# Patient Record
Sex: Male | Born: 1990 | Race: White | Hispanic: No | Marital: Single | Smoking: Never smoker
Health system: Southern US, Community
[De-identification: ages and names within clinical notes are randomized; demographics above are authoritative.]

---

## 2019-03-25 ENCOUNTER — Other Ambulatory Visit: Payer: Self-pay

## 2019-03-25 ENCOUNTER — Encounter (HOSPITAL_BASED_OUTPATIENT_CLINIC_OR_DEPARTMENT_OTHER): Payer: Self-pay | Admitting: Emergency Medicine

## 2019-03-25 ENCOUNTER — Emergency Department (HOSPITAL_BASED_OUTPATIENT_CLINIC_OR_DEPARTMENT_OTHER)
Admission: EM | Admit: 2019-03-25 | Discharge: 2019-03-26 | Disposition: A | Discharge status: Home / Self Care | Payer: 59 | Attending: Emergency Medicine | Admitting: Emergency Medicine

## 2019-03-25 ENCOUNTER — Emergency Department (HOSPITAL_BASED_OUTPATIENT_CLINIC_OR_DEPARTMENT_OTHER): Payer: 59

## 2019-03-25 DIAGNOSIS — R109 Unspecified abdominal pain: Secondary | ICD-10-CM | POA: Diagnosis present

## 2019-03-25 DIAGNOSIS — E876 Hypokalemia: Secondary | ICD-10-CM | POA: Diagnosis not present

## 2019-03-25 DIAGNOSIS — N23 Unspecified renal colic: Secondary | ICD-10-CM

## 2019-03-25 LAB — CBC WITH DIFFERENTIAL/PLATELET
Abs Immature Granulocytes: 0.08 10*3/uL — ABNORMAL HIGH (ref 0.00–0.07)
Basophils Absolute: 0 10*3/uL (ref 0.0–0.1)
Basophils Relative: 0 %
Eosinophils Absolute: 0.1 10*3/uL (ref 0.0–0.5)
Eosinophils Relative: 0 %
HCT: 47.4 % (ref 39.0–52.0)
Hemoglobin: 16.9 g/dL (ref 13.0–17.0)
Immature Granulocytes: 1 %
Lymphocytes Relative: 13 %
Lymphs Abs: 1.7 10*3/uL (ref 0.7–4.0)
MCH: 31.8 pg (ref 26.0–34.0)
MCHC: 35.7 g/dL (ref 30.0–36.0)
MCV: 89.1 fL (ref 80.0–100.0)
Monocytes Absolute: 1.2 10*3/uL — ABNORMAL HIGH (ref 0.1–1.0)
Monocytes Relative: 10 %
Neutro Abs: 9.7 10*3/uL — ABNORMAL HIGH (ref 1.7–7.7)
Neutrophils Relative %: 76 %
Platelets: 208 10*3/uL (ref 150–400)
RBC: 5.32 MIL/uL (ref 4.22–5.81)
RDW: 12 % (ref 11.5–15.5)
WBC: 12.7 10*3/uL — ABNORMAL HIGH (ref 4.0–10.5)
nRBC: 0 % (ref 0.0–0.2)

## 2019-03-25 LAB — URINALYSIS, MICROSCOPIC (REFLEX)
Bacteria, UA: NONE SEEN
Squamous Epithelial / HPF: NONE SEEN (ref 0–5)

## 2019-03-25 LAB — COMPREHENSIVE METABOLIC PANEL
ALT: 33 U/L (ref 0–44)
AST: 25 U/L (ref 15–41)
Albumin: 4.9 g/dL (ref 3.5–5.0)
Alkaline Phosphatase: 63 U/L (ref 38–126)
Anion gap: 16 — ABNORMAL HIGH (ref 5–15)
BUN: 16 mg/dL (ref 6–20)
CO2: 21 mmol/L — ABNORMAL LOW (ref 22–32)
Calcium: 9.7 mg/dL (ref 8.9–10.3)
Chloride: 97 mmol/L — ABNORMAL LOW (ref 98–111)
Creatinine, Ser: 1.12 mg/dL (ref 0.61–1.24)
GFR calc Af Amer: >60 mL/min (ref >60)
GFR calc non Af Amer: >60 mL/min (ref >60)
Glucose, Bld: 121 mg/dL — ABNORMAL HIGH (ref 70–99)
Potassium: 2.4 mmol/L — CL (ref 3.5–5.1)
Sodium: 134 mmol/L — ABNORMAL LOW (ref 135–145)
Total Bilirubin: 1.5 mg/dL — ABNORMAL HIGH (ref 0.3–1.2)
Total Protein: 8.3 g/dL — ABNORMAL HIGH (ref 6.5–8.1)

## 2019-03-25 LAB — URINALYSIS, ROUTINE W REFLEX MICROSCOPIC
Bilirubin Urine: NEGATIVE
Glucose, UA: NEGATIVE mg/dL
Ketones, ur: 40 mg/dL — AB
Leukocytes,Ua: NEGATIVE
Nitrite: NEGATIVE
Protein, ur: NEGATIVE mg/dL
Specific Gravity, Urine: 1.015 (ref 1.005–1.030)
pH: 6 (ref 5.0–8.0)

## 2019-03-25 LAB — MAGNESIUM: Magnesium: 1.7 mg/dL (ref 1.7–2.4)

## 2019-03-25 MED ORDER — ONDANSETRON HCL 4 MG/2ML IJ SOLN
4.0000 mg | Freq: Once | INTRAMUSCULAR | Status: AC
  Administered 2019-03-25: 4 mg via INTRAVENOUS
  Filled 2019-03-25: qty 2

## 2019-03-25 MED ORDER — ONDANSETRON 4 MG PO TBDP: ORAL_TABLET | ORAL | 0 refills | Status: AC

## 2019-03-25 MED ORDER — POTASSIUM CHLORIDE 10 MEQ/100ML IV SOLN
10.0000 meq | INTRAVENOUS | Status: AC
  Administered 2019-03-25 – 2019-03-26 (×3): 10 meq via INTRAVENOUS
  Filled 2019-03-25 (×2): qty 100

## 2019-03-25 MED ORDER — SODIUM CHLORIDE 0.9 % IV BOLUS
1000.0000 mL | Freq: Once | INTRAVENOUS | Status: AC
  Administered 2019-03-25: 1000 mL via INTRAVENOUS

## 2019-03-25 MED ORDER — POTASSIUM CHLORIDE CRYS ER 20 MEQ PO TBCR: 20.0000 meq | EXTENDED_RELEASE_TABLET | Freq: Every day | ORAL | 0 refills | Status: AC

## 2019-03-25 MED ORDER — KETOROLAC TROMETHAMINE 30 MG/ML IJ SOLN
30.0000 mg | Freq: Once | INTRAMUSCULAR | Status: AC
  Administered 2019-03-25: 30 mg via INTRAVENOUS
  Filled 2019-03-25: qty 1

## 2019-03-25 MED ORDER — POTASSIUM CHLORIDE CRYS ER 20 MEQ PO TBCR
40.0000 meq | EXTENDED_RELEASE_TABLET | Freq: Once | ORAL | Status: AC
  Administered 2019-03-25: 40 meq via ORAL
  Filled 2019-03-25: qty 2

## 2019-03-25 MED ORDER — POTASSIUM CHLORIDE 10 MEQ/100ML IV SOLN
10.0000 meq | Freq: Once | INTRAVENOUS | Status: AC
  Administered 2019-03-25: 10 meq via INTRAVENOUS

## 2019-03-25 MED ORDER — HYDROMORPHONE HCL 1 MG/ML IJ SOLN
1.0000 mg | Freq: Once | INTRAMUSCULAR | Status: AC
  Administered 2019-03-25: 1 mg via INTRAVENOUS
  Filled 2019-03-25: qty 1

## 2019-03-25 MED ORDER — PROMETHAZINE HCL 25 MG/ML IJ SOLN
INTRAMUSCULAR | Status: AC
  Administered 2019-03-25: 25 mg via INTRAVENOUS
  Filled 2019-03-25: qty 1

## 2019-03-25 MED ORDER — PROMETHAZINE HCL 25 MG/ML IJ SOLN
25.0000 mg | Freq: Once | INTRAMUSCULAR | Status: AC
  Administered 2019-03-25: 23:00:00 25 mg via INTRAVENOUS

## 2019-03-25 NOTE — ED Provider Notes (Signed)
[facility] DEPARTMENT Provider Note   CSN: [id number] Arrival date & time: 03/25/19  2030     History   Chief Complaint Chief Complaint  Patient presents with   Flank Pain    HPI Kenneth Frederick is a 28 y.o. male here presenting with left flank pain, left lower quadrant pain, vomiting, abdominal cramps.  Patient states that he has been having left flank pain left lower quadrant pain for the last 3 to 4 days.  He states that the pain is sharp and radiates to the left groin .  Patient states that he went to urgent care earlier today and was noted to have blood in his urine and was given antibiotics and Norco for pain. He states that he has been throwing up and his pain is not controlled with the pain medicine.  Patient has no history of kidney stones but mother had previous kidney stones.     The history is provided by the patient.    History reviewed. No pertinent past medical history.  There are no active problems to display for this patient.   History reviewed. No pertinent surgical history.      Home Medications    Prior to Admission medications   Not on File    Family History Family History  Problem Relation Age of Onset   Hypertension Mother    Heart attack Other    Diverticulitis Other     Social History Social History   Tobacco Use   Smoking status: Never Smoker   Smokeless tobacco: Never Used  Substance Use Topics   Alcohol use: Yes    Comment: occ   Drug use: Yes    Types: Marijuana     Allergies   Patient has no known allergies.   Review of Systems Review of Systems  Genitourinary: Positive for flank pain.  All other systems reviewed and are negative.    Physical Exam Updated Vital Signs BP (!) 149/100 (BP Location: Left Arm)    Pulse 85    Temp 98.1 F (36.7 C) (Oral)    Resp 16    Ht 5\' 10"  (1.778 m)    Wt 78 kg    SpO2 100%    BMI 24.68 kg/m   Physical Exam Vitals signs and nursing note reviewed.    Constitutional:      Comments: Uncomfortable, pacing around   HENT:     Head: Normocephalic.     Right Ear: Tympanic membrane normal.     Nose: Nose normal.     Mouth/Throat:     Mouth: Mucous membranes are dry.  Eyes:     Extraocular Movements: Extraocular movements intact.     Pupils: Pupils are equal, round, and reactive to light.  Neck:     Musculoskeletal: Normal range of motion.  Cardiovascular:     Rate and Rhythm: Normal rate and regular rhythm.     Pulses: Normal pulses.     Heart sounds: Normal heart sounds.  Pulmonary:     Effort: Pulmonary effort is normal.     Breath sounds: Normal breath sounds.  Abdominal:     General: Abdomen is flat.     Palpations: Abdomen is soft.     Comments: + L CVAT   Genitourinary:    Comments: Testicles nontender or swollen  Musculoskeletal: Normal range of motion.  Skin:    General: Skin is warm.     Capillary Refill: Capillary refill takes less than 2 seconds.  Neurological:  General: No focal deficit present.     Mental Status: He is alert and oriented to person, place, and time.  Psychiatric:        Mood and Affect: Mood normal.        Behavior: Behavior normal.      ED Treatments / Results  Labs (all labs ordered are listed, but only abnormal results are displayed) Labs Reviewed  CBC WITH DIFFERENTIAL/PLATELET - Abnormal; Notable for the following components:      Result Value   WBC 12.7 (*)    Neutro Abs 9.7 (*)    Monocytes Absolute 1.2 (*)    Abs Immature Granulocytes 0.08 (*)    All other components within normal limits  COMPREHENSIVE METABOLIC PANEL - Abnormal; Notable for the following components:   Sodium 134 (*)    Potassium 2.4 (*)    Chloride 97 (*)    CO2 21 (*)    Glucose, Bld 121 (*)    Total Protein 8.3 (*)    Total Bilirubin 1.5 (*)    Anion gap 16 (*)    All other components within normal limits  URINALYSIS, ROUTINE W REFLEX MICROSCOPIC    EKG None  Radiology Ct Renal Stone  Study  Result Date: 03/25/2019 CLINICAL DATA:  Flank pain. Left-sided pain. EXAM: CT ABDOMEN AND PELVIS WITHOUT CONTRAST TECHNIQUE: Multidetector CT imaging of the abdomen and pelvis was performed following the standard protocol without IV contrast. COMPARISON:  None. FINDINGS: Lower chest: The lung bases are clear. The heart size is normal. Hepatobiliary: There is decreased hepatic attenuation suggestive of hepatic steatosis. Normal gallbladder.There is no biliary ductal dilation. Pancreas: Normal contours without ductal dilatation. No peripancreatic fluid collection. Spleen: There are multiple calcifications in the spleen which are likely related to prior radial granulomatous detection. The spleen is borderline enlarged. Adrenals/Urinary Tract: --Adrenal glands: No adrenal hemorrhage. --Right kidney/ureter: There are multiple punctate nonobstructing stones in the right kidney. --Left kidney/ureter: There is moderate left-sided hydroureteronephrosis secondary to obstructing 5 mm stone in the distal left ureter, nearly at the left UVJ. There are additional punctate nonobstructing stones throughout the left kidney. --Urinary bladder: Unremarkable. Stomach/Bowel: --Stomach/Duodenum: No hiatal hernia or other gastric abnormality. Normal duodenal course and caliber. --Small bowel: No dilatation or inflammation. --Colon: No focal abnormality. --Appendix: Normal. Vascular/Lymphatic: Normal course and caliber of the major abdominal vessels. --No retroperitoneal lymphadenopathy. --No mesenteric lymphadenopathy. --No pelvic or inguinal lymphadenopathy. Reproductive: Unremarkable Other: No ascites or free air. The abdominal wall is normal. Musculoskeletal. No acute displaced fractures. IMPRESSION: 1. Moderate left-sided hydroureteronephrosis secondary to obstructing 5 mm distal left ureteral stone, nearly at the left UVJ. 2. Additional bilateral nonobstructing nephrolithiasis. 3. Hepatic steatosis. 4. Borderline  splenomegaly. Electronically Signed   By: Katherine Mantlehristopher  Green M.D.   On: 03/25/2019 21:43    Procedures Procedures (including critical care time)    Medications Ordered in ED Medications  potassium chloride 10 mEq in 100 mL IVPB (10 mEq Intravenous New Bag/Given 03/25/19 2238)  potassium chloride SA (KLOR-CON) CR tablet 40 mEq (has no administration in time range)  promethazine (PHENERGAN) injection 25 mg (has no administration in time range)  sodium chloride 0.9 % bolus 1,000 mL (0 mLs Intravenous Stopped 03/25/19 2231)  ketorolac (TORADOL) 30 MG/ML injection 30 mg (30 mg Intravenous Given 03/25/19 2122)  HYDROmorphone (DILAUDID) injection 1 mg (1 mg Intravenous Given 03/25/19 2123)  ondansetron (ZOFRAN) injection 4 mg (4 mg Intravenous Given 03/25/19 2121)  sodium chloride 0.9 % bolus 1,000 mL (1,000 mLs  Intravenous New Bag/Given 03/25/19 2233)  promethazine (PHENERGAN) 25 MG/ML injection (25 mg  Given 03/25/19 2233)     Initial Impression / Assessment and Plan / ED Course  I have reviewed the triage vital signs and the nursing notes.  Pertinent labs & imaging results that were available during my care of the patient were reviewed by me and considered in my medical decision making (see chart for details).       Kenneth Frederick is a 28 y.o. male here with L flank pain. Likely renal colic. Will get labs, UA, CT renal stone. Will give IVF, toradol, pain meds.   11:30 PM CT showed 5 mm stone with mod L hydro. K is 2.4. Magnesium is normal. UA showed blood with no UTI. Ordered 4 runs of potassium. Tolerated PO in the ED. Has vicodin ordered outpatient. Will dc home with zofran prn, urology follow up.    Final Clinical Impressions(s) / ED Diagnoses   Final diagnoses:  None    ED Discharge Orders    None       Charlynne Pander, MD 03/25/19 2332

## 2019-03-25 NOTE — ED Triage Notes (Signed)
Pt states he was diagnosed with kidney stones today at [facility] states he did not have an xray but had blood in his urine  Pt states he has been having pain in his left side for about 4 days  Pt states he was given pain medication but it is only helping of about 20 minutes each time

## 2019-03-25 NOTE — Discharge Instructions (Signed)
Stay hydrated   Take zofran for nausea   Continue your vicodin as prescribed by urgent care   Take motrin 600 mg every 6 hrs for pain   See urologist for follow up   Your potassium is very low. Take potassium daily. Repeat potassium level in a week   Return to ER if you have worse abdominal pain, vomiting, flank pain, fever.

## 2019-03-25 NOTE — ED Notes (Signed)
Pt actively vomiting  EDP notified  Orders received

## 2019-03-25 NOTE — ED Notes (Addendum)
K is 2.4 per lab. Dr. Darl Householder and primary RN  aware.

## 2019-03-27 LAB — URINE CULTURE: Culture: NO GROWTH

## 2021-06-05 IMAGING — CT CT RENAL STONE PROTOCOL
2 of 4 series · 16 of 46 positions shown, 18 images · non-contrast
Comparison: None.

CLINICAL DATA: Flank pain. Left-sided pain.

EXAM:
CT ABDOMEN AND PELVIS WITHOUT CONTRAST
TECHNIQUE: Multidetector CT imaging of the abdomen and pelvis was performed
following the standard protocol without IV contrast.

[Series 2: axial (person_name) (person_name) · axial · 0.73mm/px · z∈[+470,+910]mm · 13 of 98 slices shown, 15 images]
[im 5/98  soft-tissue]
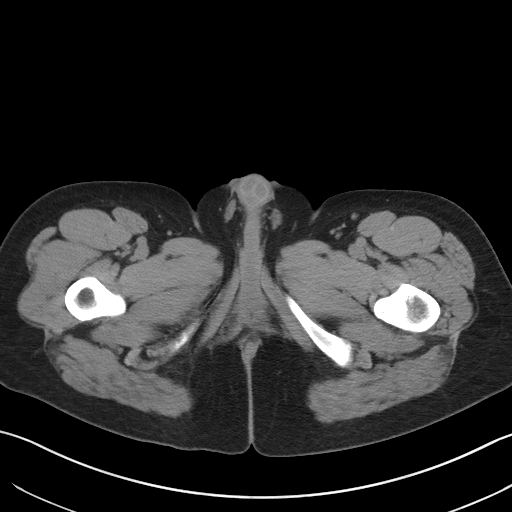
[im 5/98  bone]
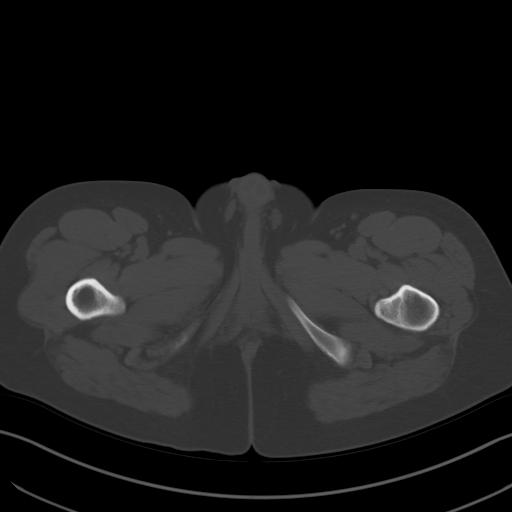
[im 13/98  soft-tissue]
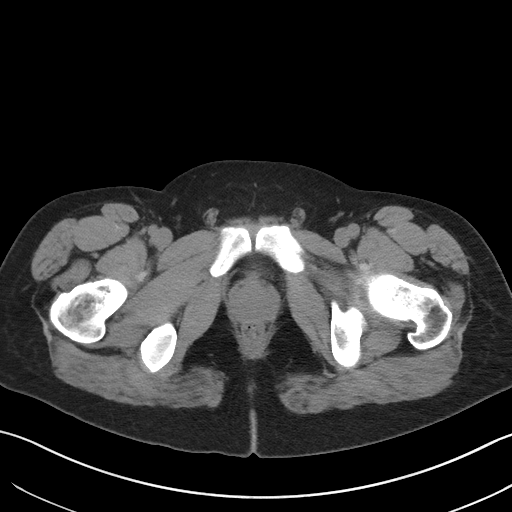
[im 22/98  soft-tissue]
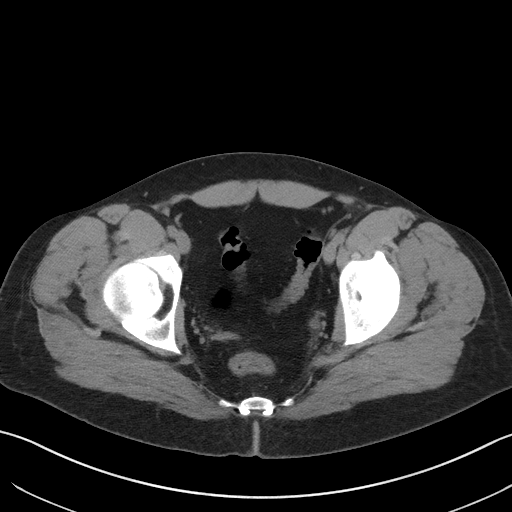
[im 26/98  soft-tissue]
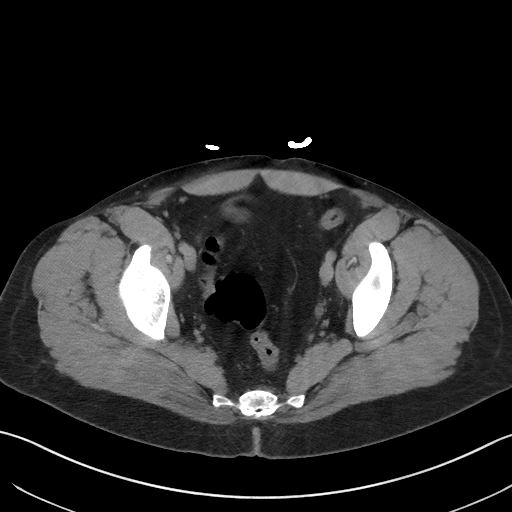
[im 34/98  soft-tissue]
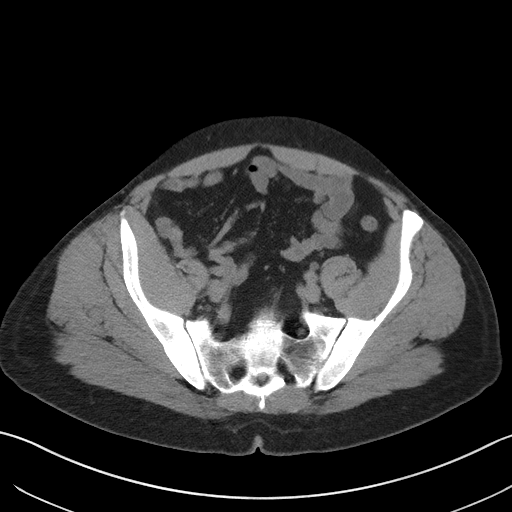
[im 43/98  soft-tissue]
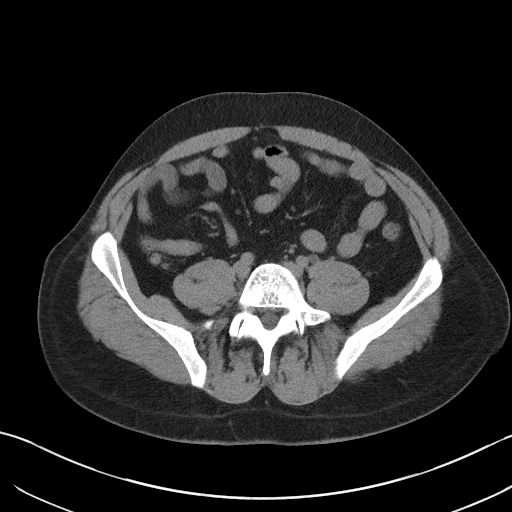
[im 51/98  soft-tissue]
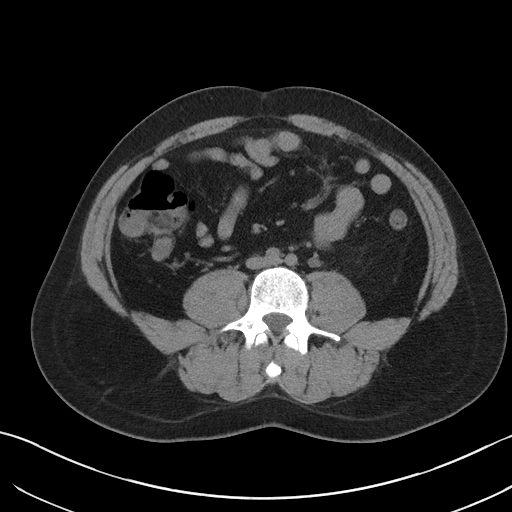
[im 55/98  soft-tissue]
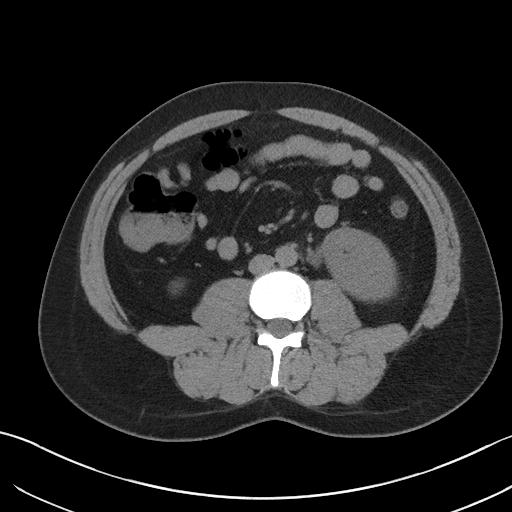
[im 64/98  soft-tissue]
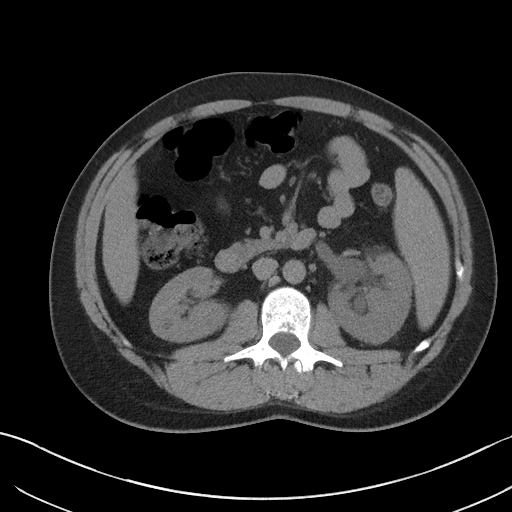
[im 64/98  bone]
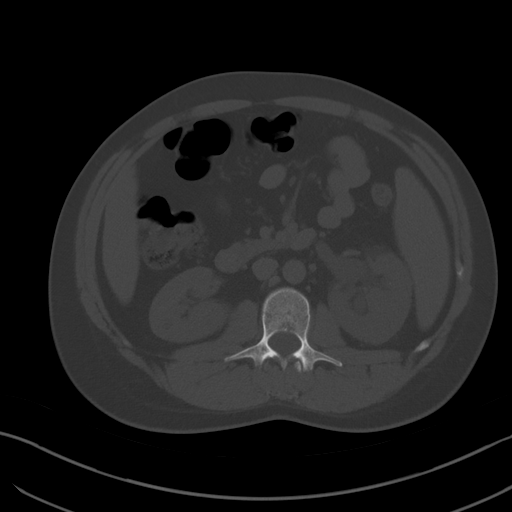
[im 72/98  soft-tissue]
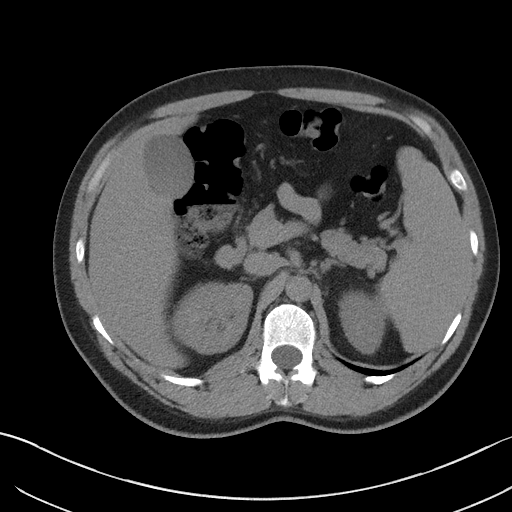
[im 76/98  soft-tissue]
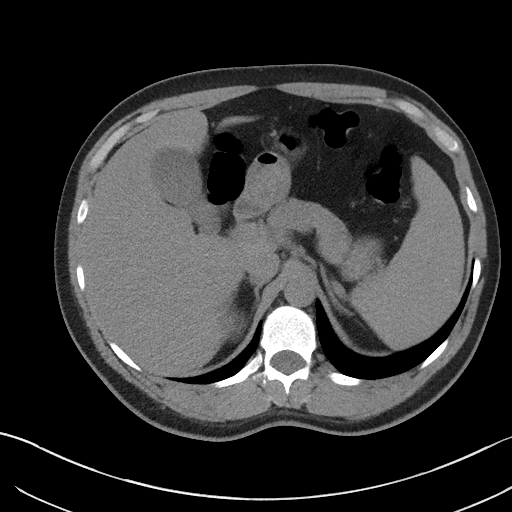
[im 85/98  soft-tissue]
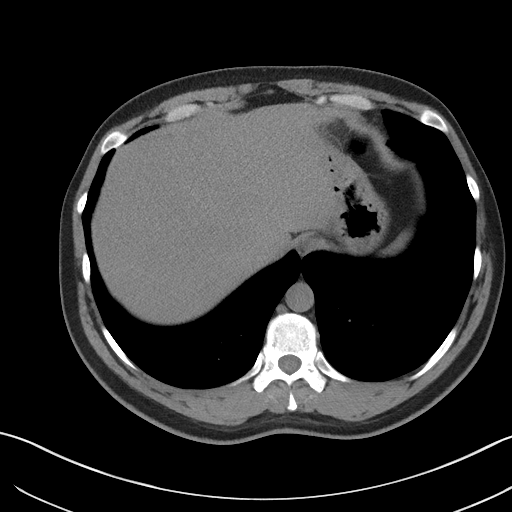
[im 93/98  soft-tissue]
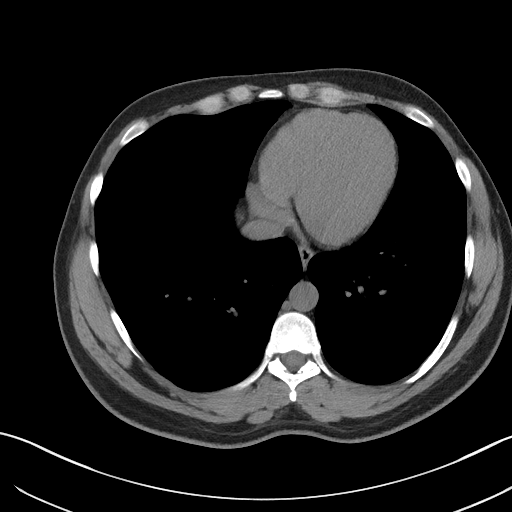

[Series 4: coronal st · coronal · 0.82mm/px · 3 of 86 slices shown]
[im 29/86  soft-tissue]
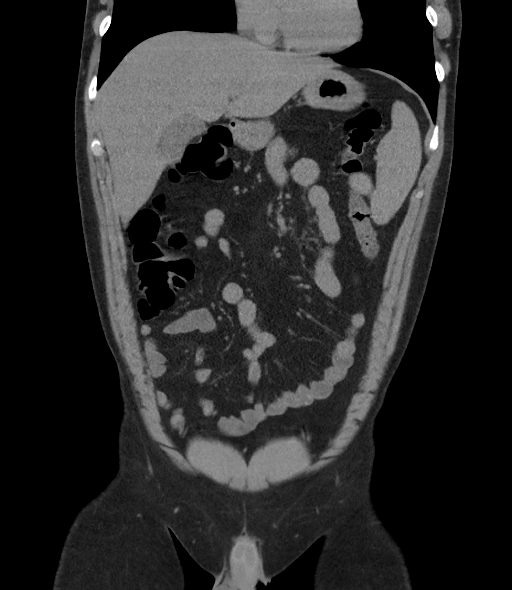
[im 38/86  soft-tissue]
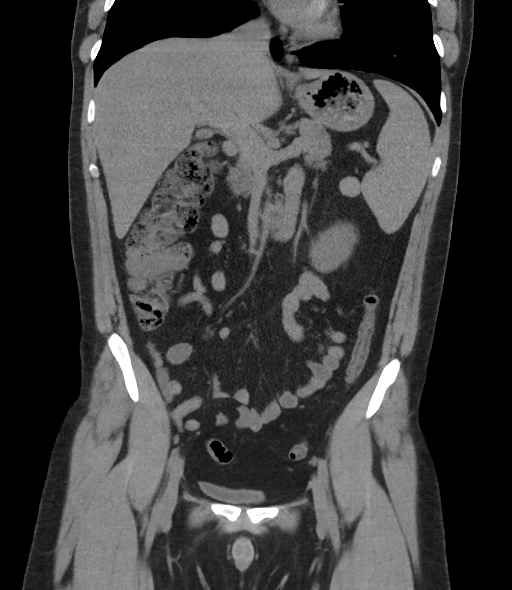
[im 48/86  soft-tissue]
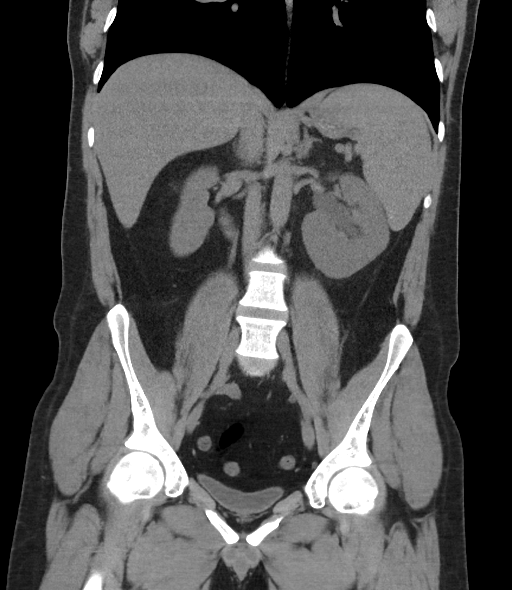

[16 of 46 positions shown; findings below may reference images not displayed]

FINDINGS: Lower chest: The lung bases are clear. The heart size is normal.

Hepatobiliary: There is decreased hepatic attenuation suggestive of
hepatic steatosis. Normal gallbladder.There is no biliary ductal
dilation.

Pancreas: Normal contours without ductal dilatation. No
peripancreatic fluid collection.

Spleen: There are multiple calcifications in the spleen which are
likely related to prior radial granulomatous detection. The spleen
is borderline enlarged.

Adrenals/Urinary Tract:

--Adrenal glands: No adrenal hemorrhage.

--Right kidney/ureter: There are multiple punctate nonobstructing
stones in the right kidney.

--Left kidney/ureter: There is moderate left-sided
hydroureteronephrosis secondary to obstructing 5 mm stone in the
distal left ureter, nearly at the left UVJ. There are additional
punctate nonobstructing stones throughout the left kidney.

--Urinary bladder: Unremarkable.

Stomach/Bowel:

--Stomach/Duodenum: No hiatal hernia or other gastric abnormality.
Normal duodenal course and caliber.

--Small bowel: No dilatation or inflammation.

--Colon: No focal abnormality.

--Appendix: Normal.

Vascular/Lymphatic: Normal course and caliber of the major abdominal
vessels.

--No retroperitoneal lymphadenopathy.

--No mesenteric lymphadenopathy.

--No pelvic or inguinal lymphadenopathy.

Reproductive: Unremarkable

Other: No ascites or free air. The abdominal wall is normal.

Musculoskeletal. No acute displaced fractures.
IMPRESSION: 1. Moderate left-sided hydroureteronephrosis secondary to
obstructing 5 mm distal left ureteral stone, nearly at the left UVJ.
2. Additional bilateral nonobstructing nephrolithiasis.
3. Hepatic steatosis.
4. Borderline splenomegaly.
